# Patient Record
Sex: Male | Born: 2000 | Race: Black or African American | Hispanic: No | Marital: Single | Smoking: Never smoker
Health system: Southern US, Community
[De-identification: ages and names within clinical notes are randomized; demographics above are authoritative.]

## PROBLEM LIST (undated history)

## (undated) DIAGNOSIS — J45909 Unspecified asthma, uncomplicated: Secondary | ICD-10-CM

---
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
## 2020-03-11 ENCOUNTER — Ambulatory Visit (HOSPITAL_COMMUNITY)
Admission: EM | Admit: 2020-03-11 | Discharge: 2020-03-11 | Disposition: A | Discharge status: Home / Self Care | Payer: Self-pay | Attending: Family Medicine | Admitting: Family Medicine

## 2020-03-11 ENCOUNTER — Encounter (HOSPITAL_COMMUNITY): Payer: Self-pay

## 2020-03-11 DIAGNOSIS — R509 Fever, unspecified: Secondary | ICD-10-CM

## 2020-03-11 DIAGNOSIS — H66002 Acute suppurative otitis media without spontaneous rupture of ear drum, left ear: Secondary | ICD-10-CM

## 2020-03-11 HISTORY — DX: Unspecified asthma, uncomplicated: J45.909

## 2020-03-11 MED ORDER — ACETAMINOPHEN 325 MG PO TABS
975.0000 mg | ORAL_TABLET | Freq: Once | ORAL | Status: AC
  Administered 2020-03-11: 975 mg via ORAL

## 2020-03-11 MED ORDER — ACETAMINOPHEN 325 MG PO TABS
ORAL_TABLET | ORAL | Status: AC
  Filled 2020-03-11: qty 3

## 2020-03-11 MED ORDER — AMOXICILLIN 500 MG PO CAPS: 1000.0000 mg | ORAL_CAPSULE | Freq: Three times a day (TID) | ORAL | 0 refills | Status: AC

## 2020-03-11 NOTE — ED Provider Notes (Signed)
[place]    CSN: [id number] Arrival date & time: 03/11/20  1829      History   Chief Complaint Chief Complaint  Patient presents with  . Otalgia  . Headache  . Sore Throat    HPI Johnny Ryan is a 19 y.o. male.   Pt is a 19 year old male that presents with nasal congestion, bilateral ear pain, sore throat, rhinorrhea.  Symptoms have been constant for the past few days.  He has fever here today at 100.5.  Has felt warm at home and had chills.  Denies any recorded fevers at home.  Has been taking ibuprofen last dose was last night.  No known sick contacts.  No cough, chest congestion.  ROS per HPI      Past Medical History:  Diagnosis Date  . Asthma     There are no problems to display for this patient.   History reviewed. No pertinent surgical history.     Home Medications    Prior to Admission medications   Medication Sig Start Date End Date Taking? Authorizing Provider  amoxicillin (AMOXIL) 500 MG capsule Take 2 capsules (1,000 mg total) by mouth 3 (three) times daily for 7 days. 03/11/20 03/18/20  Loura Halt A, NP  montelukast (SINGULAIR) 10 MG tablet Take 10 mg by mouth daily. 02/11/14   [provider]    Family History No family history on file.  Social History Social History   Tobacco Use  . Smoking status: Never Smoker  . Smokeless tobacco: Never Used  Substance Use Topics  . Alcohol use: No  . Drug use: No     Allergies   Patient has no known allergies.   Review of Systems Review of Systems   Physical Exam Triage Vital Signs ED Triage Vitals [03/11/20 0855]  Enc Vitals Group     BP 137/71     Pulse Rate 71     Resp 18     Temp (!) 100.5 F (38.1 C)     Temp Source Oral     SpO2 100 %     Weight 134 lb (60.8 kg)     Height      Head Circumference      Peak Flow      Pain Score 7     Pain Loc      Pain Edu?      Excl. in [place]?    No data found.  Updated Vital Signs BP 137/71 (BP Location:  Left Arm)   Pulse 71   Temp (!) 100.5 F (38.1 C) (Oral)   Resp 18   Wt 134 lb (60.8 kg)   SpO2 100%   Visual Acuity Right Eye Distance:   Left Eye Distance:   Bilateral Distance:    Right Eye Near:   Left Eye Near:    Bilateral Near:     Physical Exam Vitals reviewed.  Constitutional:      General: He is not in acute distress.    Appearance: Normal appearance. He is not ill-appearing, toxic-appearing or diaphoretic.  HENT:     Head: Normocephalic and atraumatic.     Right Ear: Tympanic membrane and ear canal normal.     Left Ear: Tympanic membrane is erythematous and bulging.     Nose: Nose normal.     Mouth/Throat:     Pharynx: Oropharynx is clear.  Eyes:     Conjunctiva/sclera: Conjunctivae normal.  Cardiovascular:     Rate and Rhythm:  Normal rate and regular rhythm.  Pulmonary:     Effort: Pulmonary effort is normal.     Breath sounds: Normal breath sounds.  Musculoskeletal:        General: Normal range of motion.  Skin:    General: Skin is warm and dry.  Neurological:     Mental Status: He is alert.  Psychiatric:        Mood and Affect: Mood normal.      UC Treatments / Results  Labs (all labs ordered are listed, but only abnormal results are displayed) Labs Reviewed - No data to display  EKG   Radiology No results found.  Procedures Procedures (including critical care time)  Medications Ordered in UC Medications  acetaminophen (TYLENOL) tablet 975 mg (975 mg Oral Given 03/11/20 0931)    Initial Impression / Assessment and Plan / UC Course  I have reviewed the triage vital signs and the nursing notes.  Pertinent labs & imaging results that were available during my care of the patient were reviewed by me and considered in my medical decision making (see chart for details).     Otitis media- erythematous and bulging TM on the left.  Treating for otitis media with amoxicillin. Tylenol given here for fever He can take Tylenol and ibuprofen  at home for pain and fever Follow up as needed for continued or worsening symptoms  Final Clinical Impressions(s) / UC Diagnoses   Final diagnoses:  Non-recurrent acute suppurative otitis media of left ear without spontaneous rupture of tympanic membrane  Fever, unspecified     Discharge Instructions     Treating you for ear infection with amoxicillin.  Take the medication as prescribed. Tylenol and ibuprofen as needed for fever and headache Flonase for sinus congestion allergy symptoms Follow up as needed for continued or worsening symptoms     ED Prescriptions    Medication Sig Dispense Auth. Provider   amoxicillin (AMOXIL) 500 MG capsule Take 2 capsules (1,000 mg total) by mouth 3 (three) times daily for 7 days. 42 capsule Jakota Manthei A, NP     PDMP not reviewed this encounter.   Janace Aris, NP 03/11/20 1025

## 2020-03-11 NOTE — Discharge Instructions (Addendum)
Treating you for ear infection with amoxicillin.  Take the medication as prescribed. Tylenol and ibuprofen as needed for fever and headache Flonase for sinus congestion allergy symptoms Follow up as needed for continued or worsening symptoms

## 2020-03-11 NOTE — ED Triage Notes (Signed)
Pt reports he normally get sinus infections around this time of year. Reports headache, bilateral ear pain, sore throat, nasal congestion. Hx asthma.
